# Patient Record
Sex: Female | Born: 1970 | Race: Black or African American | Hispanic: No | Marital: Married | Smoking: Never smoker
Health system: Southern US, Community
[De-identification: ages and names within clinical notes are randomized; demographics above are authoritative.]

## PROBLEM LIST (undated history)

## (undated) HISTORY — PX: TUBAL LIGATION: SHX77

---

## 2011-02-26 ENCOUNTER — Emergency Department (HOSPITAL_BASED_OUTPATIENT_CLINIC_OR_DEPARTMENT_OTHER)
Admission: EM | Admit: 2011-02-26 | Discharge: 2011-02-26 | Disposition: A | Discharge status: Home / Self Care | Payer: BC Managed Care – PPO | Attending: Emergency Medicine | Admitting: Emergency Medicine

## 2011-02-26 DIAGNOSIS — N920 Excessive and frequent menstruation with regular cycle: Secondary | ICD-10-CM | POA: Insufficient documentation

## 2011-02-26 DIAGNOSIS — N760 Acute vaginitis: Secondary | ICD-10-CM | POA: Insufficient documentation

## 2011-02-26 DIAGNOSIS — A499 Bacterial infection, unspecified: Secondary | ICD-10-CM | POA: Insufficient documentation

## 2011-02-26 DIAGNOSIS — B9689 Other specified bacterial agents as the cause of diseases classified elsewhere: Secondary | ICD-10-CM | POA: Insufficient documentation

## 2011-02-26 DIAGNOSIS — N898 Other specified noninflammatory disorders of vagina: Secondary | ICD-10-CM | POA: Insufficient documentation

## 2011-02-26 LAB — WET PREP, GENITAL
Trich, Wet Prep: NONE SEEN
Yeast Wet Prep HPF POC: NONE SEEN

## 2011-02-26 LAB — URINE MICROSCOPIC-ADD ON

## 2011-02-26 LAB — CBC
Hemoglobin: 11.8 g/dL — ABNORMAL LOW (ref 12.0–15.0)
Platelets: 302 10*3/uL (ref 150–400)
RBC: 4.58 MIL/uL (ref 3.87–5.11)

## 2011-02-26 LAB — PREGNANCY, URINE: Preg Test, Ur: NEGATIVE

## 2011-02-26 LAB — URINALYSIS, ROUTINE W REFLEX MICROSCOPIC
Glucose, UA: NEGATIVE mg/dL
Ketones, ur: 15 mg/dL — AB
Protein, ur: 30 mg/dL — AB
Urobilinogen, UA: 1 mg/dL (ref 0.0–1.0)

## 2011-02-26 LAB — BASIC METABOLIC PANEL
CO2: 26 mEq/L (ref 19–32)
Calcium: 9.2 mg/dL (ref 8.4–10.5)
Chloride: 100 mEq/L (ref 96–112)
GFR calc Af Amer: >60 mL/min (ref >60)
Sodium: 136 mEq/L (ref 135–145)

## 2011-02-26 LAB — DIFFERENTIAL
Basophils Absolute: 0 10*3/uL (ref 0.0–0.1)
Basophils Relative: 0 % (ref 0–1)
Eosinophils Absolute: 0.1 10*3/uL (ref 0.0–0.7)
Neutro Abs: 3.9 10*3/uL (ref 1.7–7.7)
Neutrophils Relative %: 52 % (ref 43–77)

## 2011-02-27 LAB — RPR: RPR Ser Ql: NONREACTIVE

## 2011-03-01 LAB — URINE CULTURE: Culture  Setup Time: 201206030112

## 2012-02-15 ENCOUNTER — Encounter (HOSPITAL_BASED_OUTPATIENT_CLINIC_OR_DEPARTMENT_OTHER): Payer: Self-pay

## 2012-02-15 ENCOUNTER — Emergency Department (HOSPITAL_BASED_OUTPATIENT_CLINIC_OR_DEPARTMENT_OTHER)
Admission: EM | Admit: 2012-02-15 | Discharge: 2012-02-15 | Disposition: A | Discharge status: Home / Self Care | Payer: Worker's Compensation | Attending: Emergency Medicine | Admitting: Emergency Medicine

## 2012-02-15 ENCOUNTER — Emergency Department (HOSPITAL_BASED_OUTPATIENT_CLINIC_OR_DEPARTMENT_OTHER): Payer: Worker's Compensation

## 2012-02-15 DIAGNOSIS — W108XXA Fall (on) (from) other stairs and steps, initial encounter: Secondary | ICD-10-CM | POA: Insufficient documentation

## 2012-02-15 DIAGNOSIS — S93409A Sprain of unspecified ligament of unspecified ankle, initial encounter: Secondary | ICD-10-CM

## 2012-02-15 MED ORDER — HYDROCODONE-ACETAMINOPHEN 5-325 MG PO TABS
1.0000 | ORAL_TABLET | Freq: Once | ORAL | Status: AC
  Administered 2012-02-15: 1 via ORAL
  Filled 2012-02-15: qty 1

## 2012-02-15 MED ORDER — HYDROCODONE-ACETAMINOPHEN 5-500 MG PO TABS: 1.0000 | ORAL_TABLET | Freq: Four times a day (QID) | ORAL | Status: AC | PRN

## 2012-02-15 NOTE — Discharge Instructions (Signed)
Ankle Sprain An ankle sprain is an injury to the strong, fibrous tissues (ligaments) that hold the bones of your ankle joint together.  CAUSES Ankle sprain usually is caused by a fall or by twisting your ankle. People who participate in sports are more prone to these types of injuries.  SYMPTOMS  Symptoms of ankle sprain include:  Pain in your ankle. The pain may be present at rest or only when you are trying to stand or walk.   Swelling.   Bruising. Bruising may develop immediately or within 1 to 2 days after your injury.   Difficulty standing or walking.  DIAGNOSIS  Your caregiver will ask you details about your injury and perform a physical exam of your ankle to determine if you have an ankle sprain. During the physical exam, your caregiver will press and squeeze specific areas of your foot and ankle. Your caregiver will try to move your ankle in certain ways. An X-ray exam may be done to be sure a bone was not broken or a ligament did not separate from one of the bones in your ankle (avulsion).  TREATMENT  Certain types of braces can help stabilize your ankle. Your caregiver can make a recommendation for this. Your caregiver may recommend the use of medication for pain. If your sprain is severe, your caregiver may refer you to a surgeon who helps to restore function to parts of your skeletal system (orthopedist) or a physical therapist. HOME CARE INSTRUCTIONS  Apply ice to your injury for 1 to 2 days or as directed by your caregiver. Applying ice helps to reduce inflammation and pain.  Put ice in a plastic bag.   Place a towel between your skin and the bag.   Leave the ice on for 15 to 20 minutes at a time, every 2 hours while you are awake.   Take over-the-counter or prescription medicines for pain, discomfort, or fever only as directed by your caregiver.   Keep your injured leg elevated, when possible, to lessen swelling.   If your caregiver recommends crutches, use them as  instructed. Gradually, put weight on the affected ankle. Continue to use crutches or a cane until you can walk without feeling pain in your ankle.   If you have a plaster splint, wear the splint as directed by your caregiver. Do not rest it on anything harder than a pillow the first 24 hours. Do not put weight on it. Do not get it wet. You may take it off to take a shower or bath.   You may have been given an elastic bandage to wear around your ankle to provide support. If the elastic bandage is too tight (you have numbness or tingling in your foot or your foot becomes cold and blue), adjust the bandage to make it comfortable.   If you have an air splint, you may blow more air into it or let air out to make it more comfortable. You may take your splint off at night and before taking a shower or bath.   Wiggle your toes in the splint several times per day if you are able.  SEEK MEDICAL CARE IF:   You have an increase in bruising, swelling, or pain.   Your toes feel cold.   Pain relief is not achieved with medication.  SEEK IMMEDIATE MEDICAL CARE IF: Your toes are numb or blue or you have severe pain. MAKE SURE YOU:   Understand these instructions.   Will watch your condition.     Will get help right away if you are not doing well or get worse.  Document Released: 09/12/2005 Document Revised: 09/01/2011 Document Reviewed: 04/16/2008 [employer] Patient Information 2012 [employer]. 

## 2012-02-15 NOTE — ED Provider Notes (Signed)
History     CSN: [id number]  Arrival date & time 02/15/12  1441   First MD Initiated Contact with Patient 02/15/12 1501      Chief Complaint  Patient presents with  . Ankle Injury    (Consider location/radiation/quality/duration/timing/severity/associated sxs/prior treatment) HPI Comments: Pt states that she fell on the stairs and twisted her ankle and foot:pt states that she has no previous injury:pt states that she was able to ambulate after fall:pt denies loc  Patient is a 41 y.o. female presenting with lower extremity injury. The history is provided by the patient.  Ankle Injury This is a new problem. The current episode started today. The problem occurs constantly. The problem has been unchanged. The symptoms are aggravated by walking and twisting. She has tried nothing for the symptoms.    History reviewed. No pertinent past medical history.  Past Surgical History  Procedure Date  . Tubal ligation     No family history on file.  History  Substance Use Topics  . Smoking status: Never Smoker   . Smokeless tobacco: Not on file  . Alcohol Use: No    OB History    Grav Para Term Preterm Abortions TAB SAB Ect Mult Living                  Review of Systems  Constitutional: Negative.   Respiratory: Negative.   Cardiovascular: Negative.   Neurological: Negative.     Allergies  Review of patient's allergies indicates no known allergies.  Home Medications  No current outpatient prescriptions on file.  BP 163/88  Pulse 95  Resp 16  Ht 5\' 7"  (1.702 m)  Wt 180 lb (81.647 kg)  BMI 28.19 kg/m2  SpO2 100%  LMP 01/18/2012  Physical Exam  Nursing note and vitals reviewed. Constitutional: She is oriented to person, place, and time. She appears well-developed and well-nourished.  HENT:  Head: Normocephalic and atraumatic.  Eyes: EOM are normal.  Cardiovascular: Normal rate and regular rhythm.   Pulmonary/Chest: Effort normal and breath sounds normal.    Musculoskeletal:       Pt has tenderness and swelling to the right lateral ankle:Pt has full WUJ:WJXBJY intact  Neurological: She is alert and oriented to person, place, and time.  Skin: Skin is warm and dry.  Psychiatric: She has a normal mood and affect.    ED Course  Procedures (including critical care time)  Labs Reviewed - No data to display Dg Ankle Complete Right  02/15/2012  *RADIOLOGY REPORT*  Clinical Data: Fall with pain.  RIGHT ANKLE - COMPLETE 3+ VIEW  Comparison: None.  Findings: Mild lateral soft tissue swelling.  No acute osseous or joint abnormality.  Calcaneal spurs.  IMPRESSION: Mild lateral soft tissue swelling.  No acute findings.  Original Report Authenticated By: Reyes Ivan, M.D.   Dg Foot Complete Right  02/15/2012  *RADIOLOGY REPORT*  Clinical Data: Twisting injury.  Ankle and foot pain and swelling.  RIGHT FOOT COMPLETE - 3+ VIEW  Comparison: None.  Findings: The mineralization and alignment are normal.  There is no evidence of acute fracture or dislocation.  There are mild degenerative changes at the first metatarsal phalangeal joint. There are small calcaneal spurs and a type 1 accessory navicular.  IMPRESSION: No acute osseous findings.  Original Report Authenticated By: Gerrianne Scale, M.D.     1. Ankle sprain       MDM  No bony abnormality noted:pt treated with splinting from the nursing staff and  vicodin        Teressa Lower, NP 02/15/12 1556

## 2012-02-15 NOTE — ED Notes (Signed)
Pt reports fell on steps at work approx 1pm-pain to right ankle

## 2012-02-17 NOTE — ED Provider Notes (Signed)
Medical screening examination/treatment/procedure(s) were performed by non-physician practitioner and as supervising physician I was immediately available for consultation/collaboration.  Geoffery Lyons, MD 02/17/12 [phone]

## 2013-05-20 IMAGING — CR DG FOOT COMPLETE 3+V*R*
3 series · 3 of 3 positions shown · non-contrast
Comparison: None.

CLINICAL DATA: Twisting injury.  Ankle and foot pain and swelling.

RIGHT FOOT COMPLETE - 3+ VIEW

[t foot lat right]
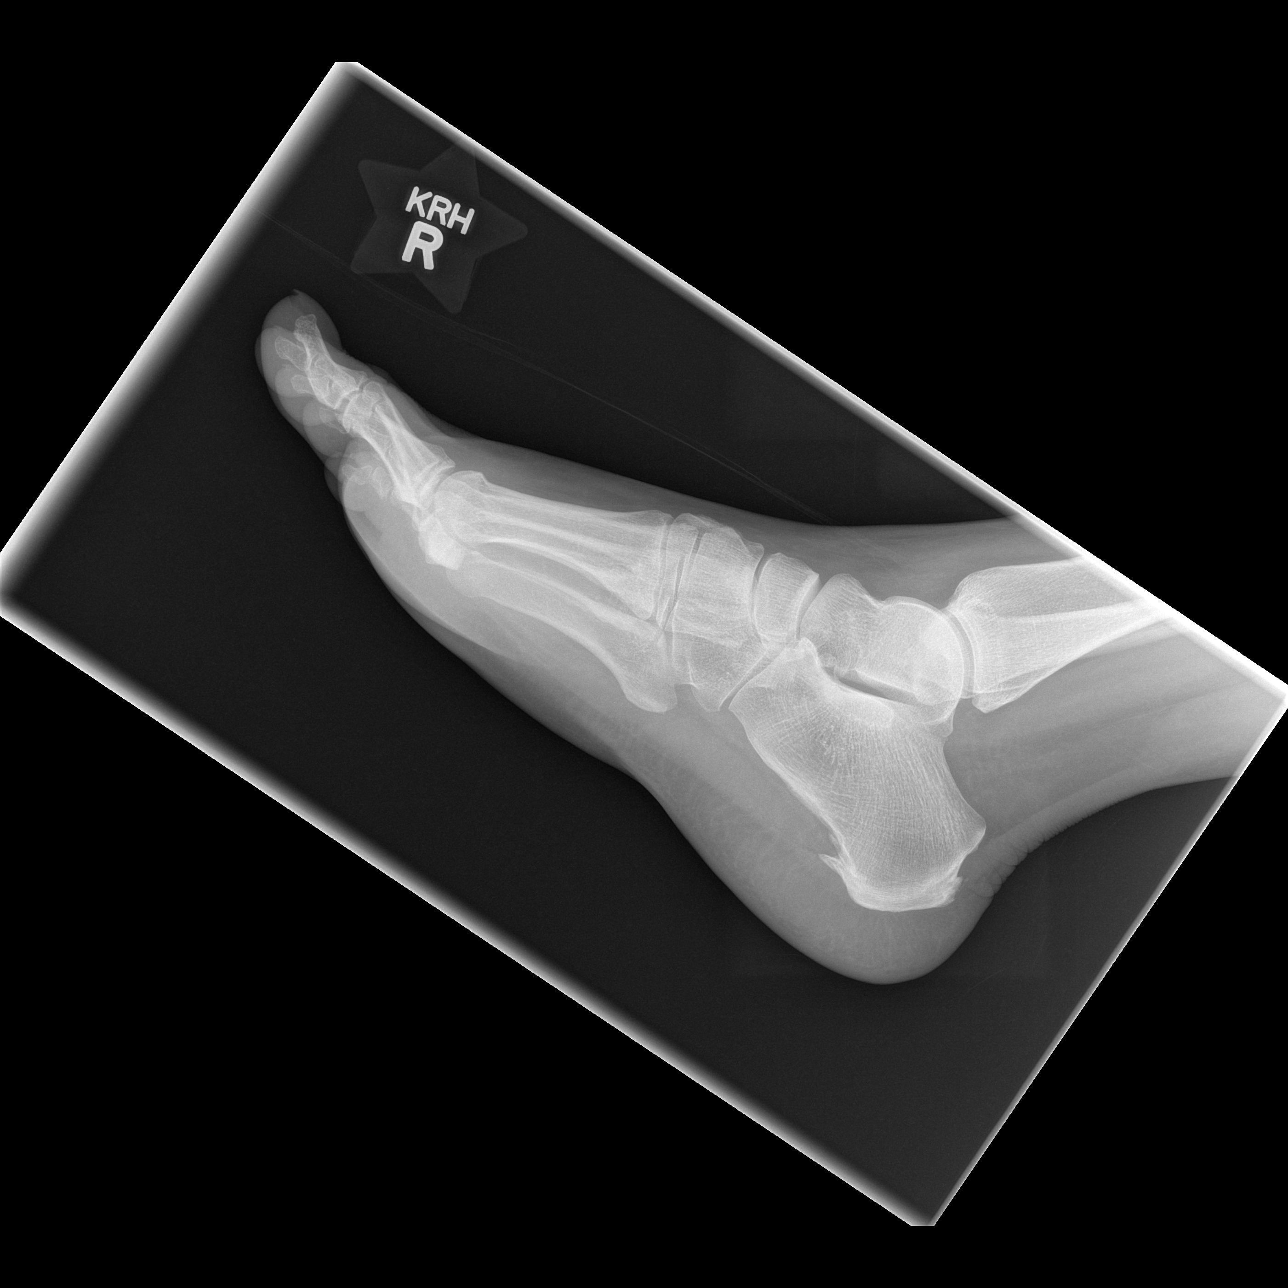

[t foot ap right]
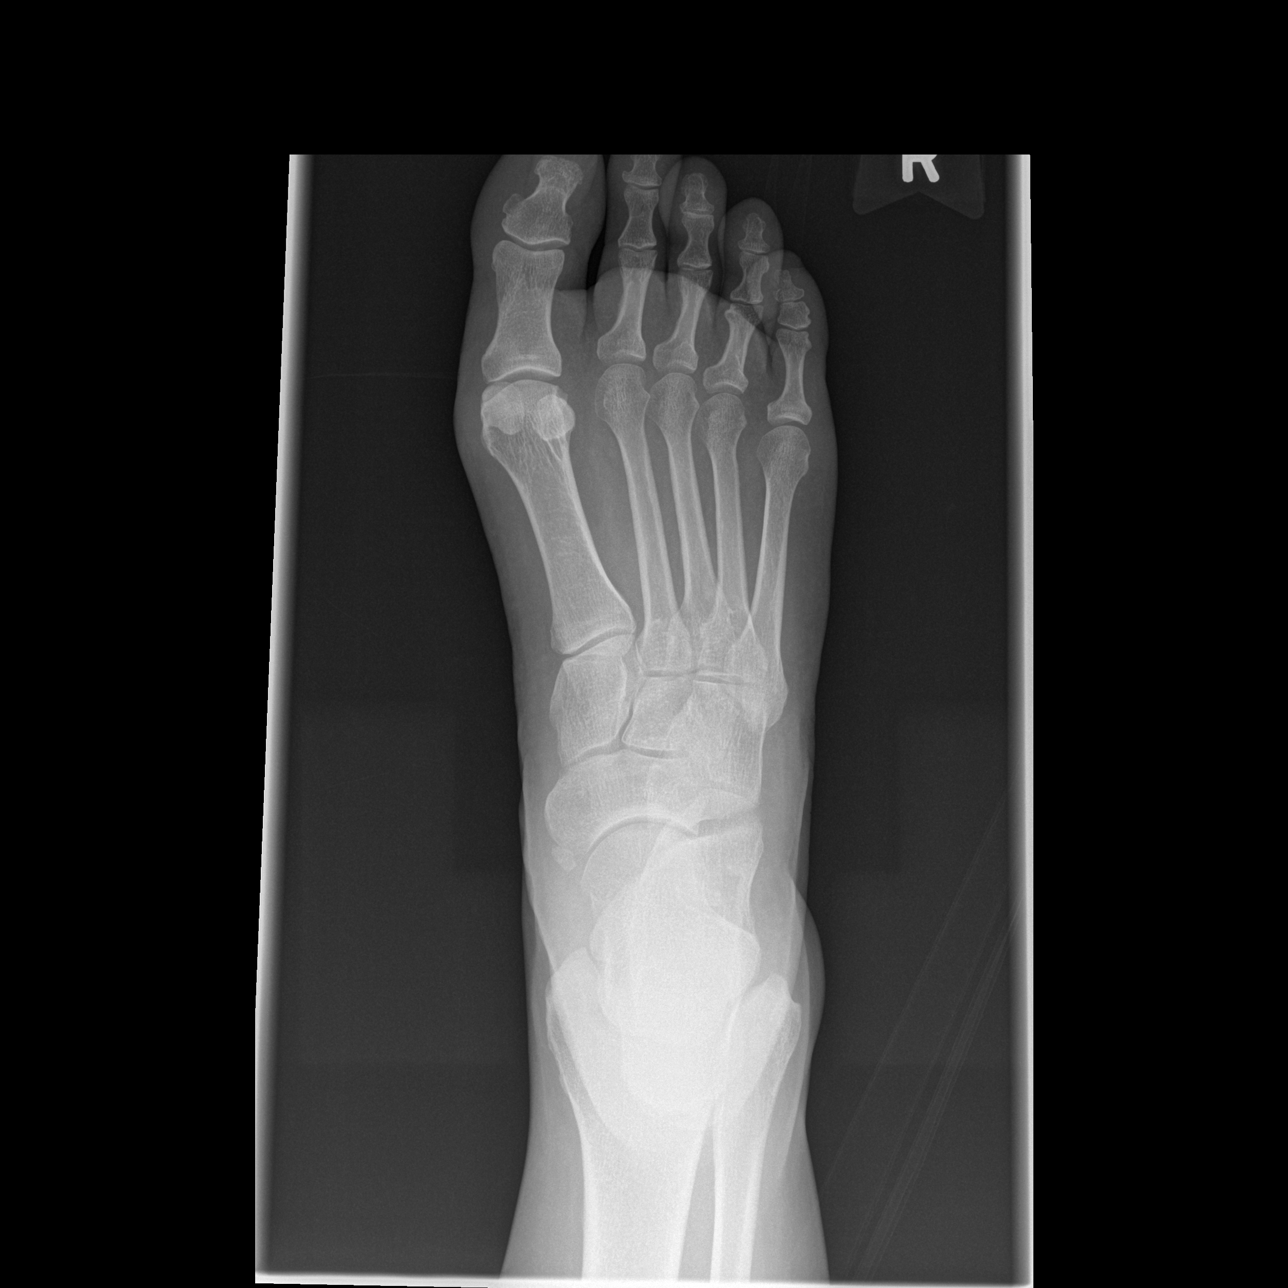

[t foot oblique right]
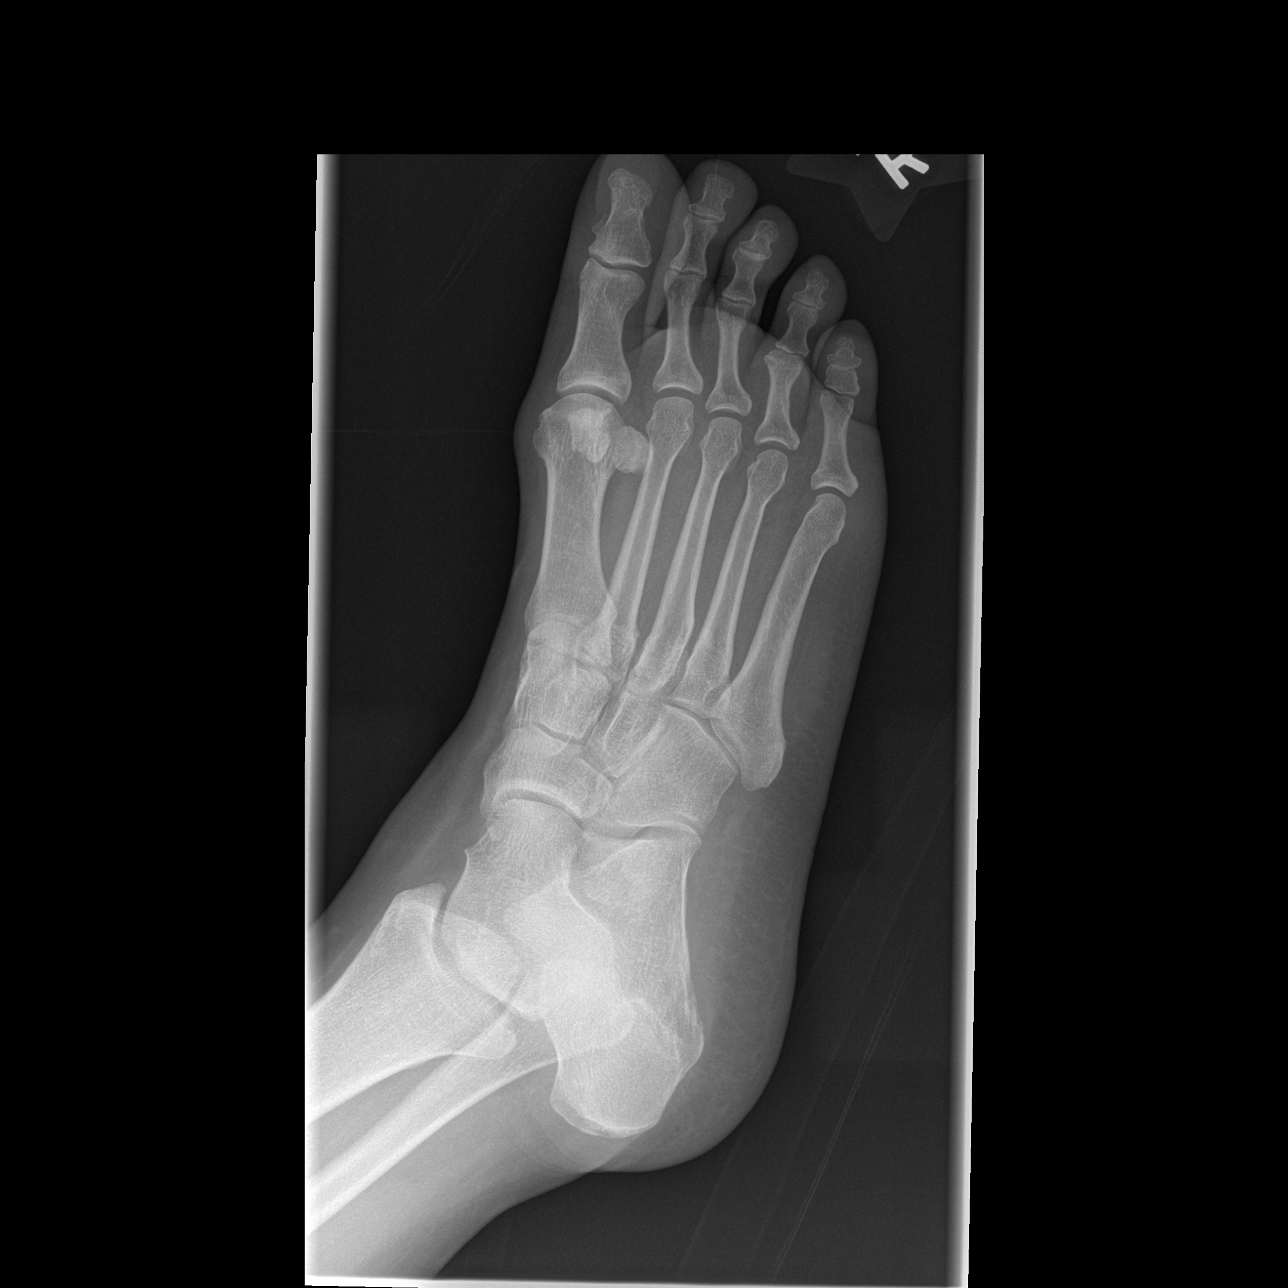

[3 of 3 positions shown; findings below may reference images not displayed]

FINDINGS: The mineralization and alignment are normal.  There is no
evidence of acute fracture or dislocation.  There are mild
degenerative changes at the first metatarsal phalangeal joint.
There are small calcaneal spurs and a type 1 accessory navicular.
IMPRESSION: No acute osseous findings.

## 2019-05-21 ENCOUNTER — Other Ambulatory Visit: Payer: Self-pay

## 2019-05-21 ENCOUNTER — Encounter (HOSPITAL_BASED_OUTPATIENT_CLINIC_OR_DEPARTMENT_OTHER): Payer: Self-pay | Admitting: Emergency Medicine

## 2019-05-21 ENCOUNTER — Emergency Department (HOSPITAL_BASED_OUTPATIENT_CLINIC_OR_DEPARTMENT_OTHER): Payer: 59

## 2019-05-21 ENCOUNTER — Emergency Department (HOSPITAL_BASED_OUTPATIENT_CLINIC_OR_DEPARTMENT_OTHER)
Admission: EM | Admit: 2019-05-21 | Discharge: 2019-05-21 | Disposition: A | Discharge status: Home / Self Care | Payer: 59 | Attending: Emergency Medicine | Admitting: Emergency Medicine

## 2019-05-21 DIAGNOSIS — M79645 Pain in left finger(s): Secondary | ICD-10-CM | POA: Diagnosis present

## 2019-05-21 NOTE — ED Provider Notes (Signed)
[facility] Provider Note   CSN: [id number] Arrival date & time: 05/21/19  1710     History   Chief Complaint Chief Complaint  Patient presents with  . Hand Pain    HPI Yailin Biederman is a 48 y.o. female presents to the ER for evaluation of gradual onset, persistent, worsening left thumb pain that began 1 week ago.  It is located to the MCP and IP joint.  It is worse with palpation and movement.  Has tried over-the-counter medicines without significant relief.  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  She denies any trauma.  She is right-hand dominant.  She denies any tingling, loss of sensation, joint swelling redness warmth fevers.  No elbow or shoulder or neck pain.     HPI  History reviewed. No pertinent past medical history.  There are no active problems to display for this patient.   Past Surgical History:  Procedure Laterality Date  . TUBAL LIGATION       OB History   No obstetric history on file.      Home Medications    Prior to Admission medications   Not on File    Family History No family history on file.  Social History Social History   Tobacco Use  . Smoking status: Never Smoker  . Smokeless tobacco: Never Used  Substance Use Topics  . Alcohol use: No  . Drug use: No     Allergies   Patient has no known allergies.   Review of Systems Review of Systems  Musculoskeletal: Positive for arthralgias.  All other systems reviewed and are negative.    Physical Exam Updated Vital Signs BP (!) 187/95 (BP Location: Right Arm)   Pulse 77   Temp 99 F (37.2 C) (Oral)   Resp 18   Ht 5\' 6"  (1.676 m)   Wt 86.2 kg   LMP 04/30/2019   SpO2 100%   BMI 30.67 kg/m   Physical Exam Constitutional:      Appearance: She is well-developed.  HENT:     Head: Normocephalic.     Nose: Nose normal.  Eyes:     General: Lids are normal.  Neck:     Musculoskeletal: Normal range of motion.  Cardiovascular:      Rate and Rhythm: Normal rate.     Comments: Brisk cap refill to the left thumb pad Pulmonary:     Effort: Pulmonary effort is normal. No respiratory distress.  Musculoskeletal: Normal range of motion.        General: Tenderness present.     Comments: Focal bony tenderness to the IP joint, proximal phalanx, M CP of the left thumb.  Reproducible pain with active flexion at the MCP and IP joint of the thumb.  No focal bony tenderness to the metacarpal, scaphoid.  Negative scaphoid tenderness.  No tenderness to carpal tunnel. Negative Tinnel's and Phalen's  No wrist, forearm, elbow focal bony tenderness.  Full range motion of these joints without any pain.  Neurological:     Mental Status: She is alert.     Comments: Sensation is intact in the left thumb pad.  Strength with flexion/extension and opposition against resistance is intact.  Psychiatric:        Behavior: Behavior normal.      ED Treatments / Results  Labs (all labs ordered are listed, but only abnormal results are displayed) Labs Reviewed - No data to display  EKG None  Radiology Dg  Finger Thumb Left  Result Date: 05/21/2019 CLINICAL DATA:  Atraumatic left thumb pain EXAM: LEFT THUMB 2+V COMPARISON:  None. FINDINGS: There is no evidence of fracture or dislocation. There is no evidence of arthropathy or other focal bone abnormality. Soft tissues are unremarkable. IMPRESSION: Negative. Electronically Signed   By: Charlett NoseKevin  Dover M.D.   On: 05/21/2019 19:03    Procedures Procedures (including critical care time)  Medications Ordered in ED Medications - No data to display   Initial Impression / Assessment and Plan / ED Course  I have reviewed the triage vital signs and the nursing notes.  Pertinent labs & imaging results that were available during my care of the patient were reviewed by me and considered in my medical decision making (see chart for details).  Atraumatic left thumb pain.  X-ray obtained here reviewed by  me does not show any fracture, dislocation or arthropathy.  Given her profession I suspect there is some soft tissue overuse injury.  No signs of scaphoid tenderness.  No signs of overlying infection.  No signs of trigger finger.  History and exam is not consistent with gout, septic arthritis.  She has no proximal upper extremity or neck pain to cause radiculopathy.  No tenderness to the compartments of the medial nerve and she has no tingling, loss of sensation to suggest peripheral neuropathy or carpal tunnel syndrome.  Will DC with NSAIDs, ice, activity modification, thumb spica to help with pain.  Recommended hand surgery evaluation for persistent symptoms.  Return precautions given.  Patient comfortable this.  Final Clinical Impressions(s) / ED Diagnoses   Final diagnoses:  Thumb pain, left    ED Discharge Orders    None       Jerrell MylarGibbons, Ramses Klecka J, PA-C 05/21/19 2101    Tegeler, Canary Brimhristopher J, MD 05/22/19 [phone]

## 2019-05-21 NOTE — Discharge Instructions (Signed)
You were seen in the ER for left thumb pain.  There was no trauma.  X-ray obtained today did not show any acute bony abnormalities, arthritis, fracture, dislocation.  The cause of your pain is unclear but it may be due to a soft tissue overuse injury involving your tendon, muscles or nerves.  For pain and inflammation you can use a combination of ibuprofen and acetaminophen.  Take [phone] mg acetaminophen (tylenol) every 6 hours or 600 mg ibuprofen (advil, motrin) every 6 hours.  You can take these separately or combine them every 6 hours for maximum pain control. Do not exceed 4,000 mg acetaminophen or 2,400 mg ibuprofen in a 24 hour period.  Do not take ibuprofen containing products if you have history of kidney disease, ulcers, GI bleeding, severe acid reflux, or take a blood thinner.  Do not take acetaminophen if you have liver disease.   Modify your activity and avoid typing, frequent movements of the hand.  Wear your thumb splint for comfort for the next 3 to 5 days to prevent further injury movement.  Ice.  Return to the ER for worsening pain, associated redness warmth fever chills loss of sensation to the fingertip

## 2019-05-21 NOTE — ED Triage Notes (Signed)
L thumb pain for several days. No known injury.

## 2020-08-23 IMAGING — CR LEFT THUMB 2+V
3 series · 3 of 3 positions shown · non-contrast
Comparison: None.

CLINICAL DATA: Atraumatic left thumb pain

EXAM:
LEFT THUMB 2+V

[x finger pa left]
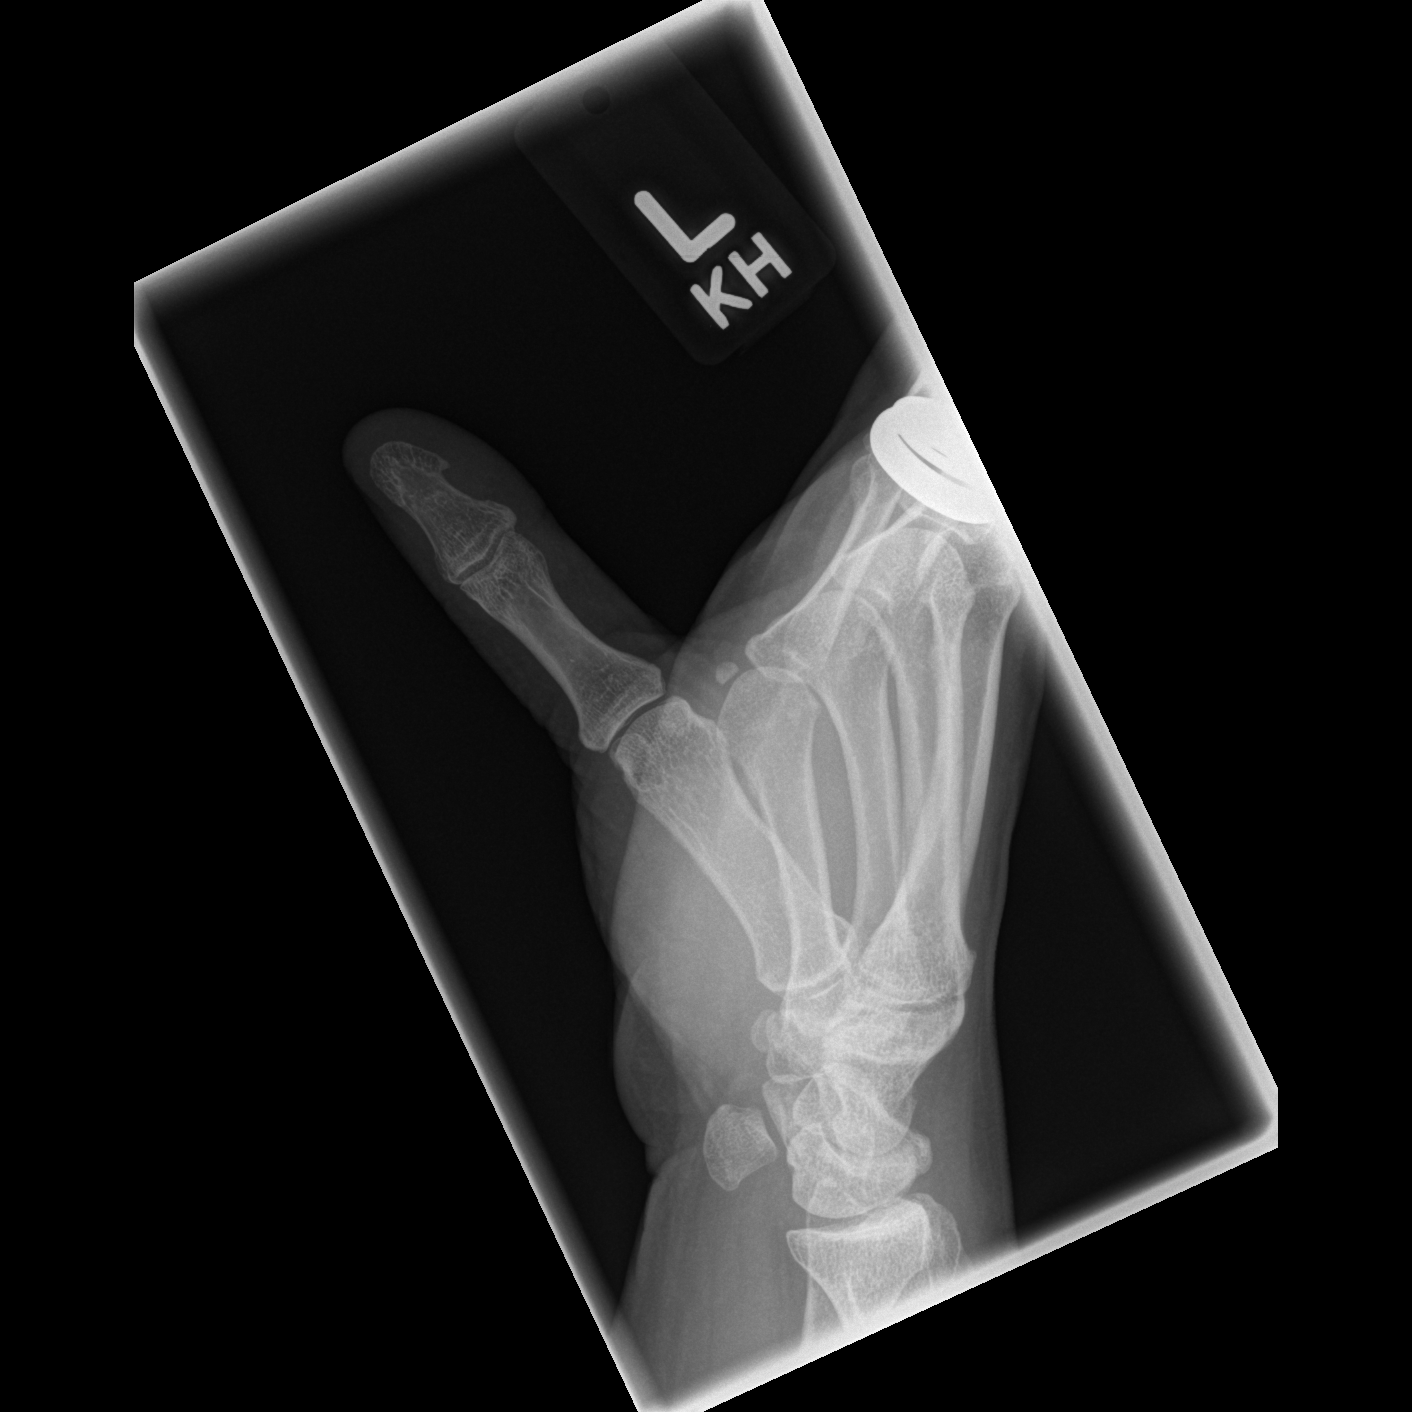

[x finger obl. left]
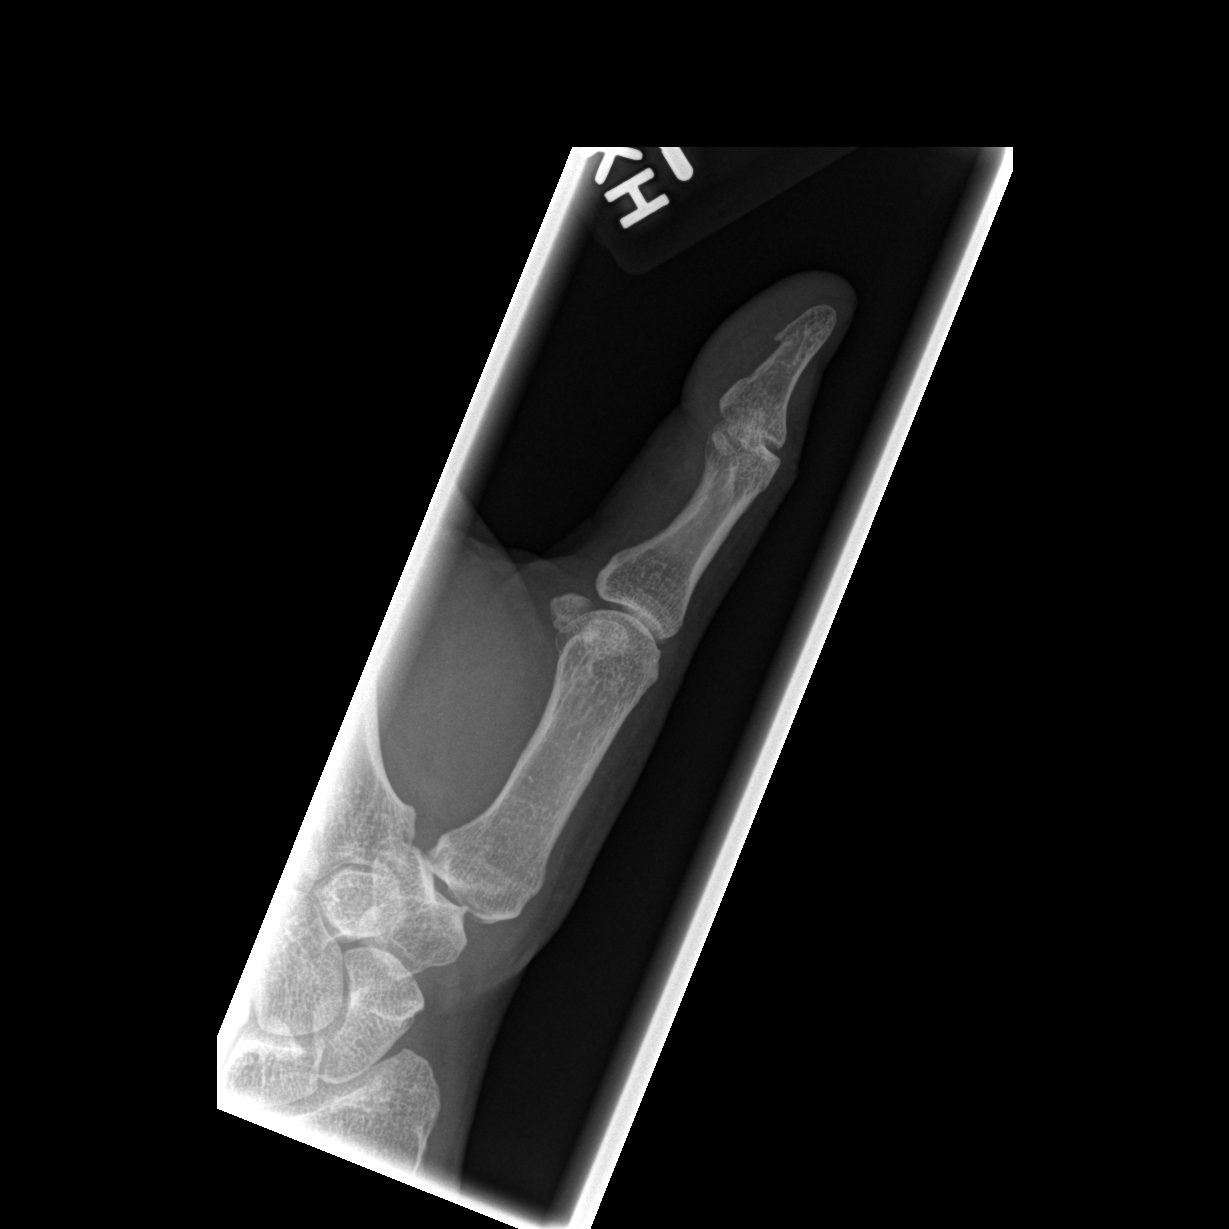

[x finger lateral left]
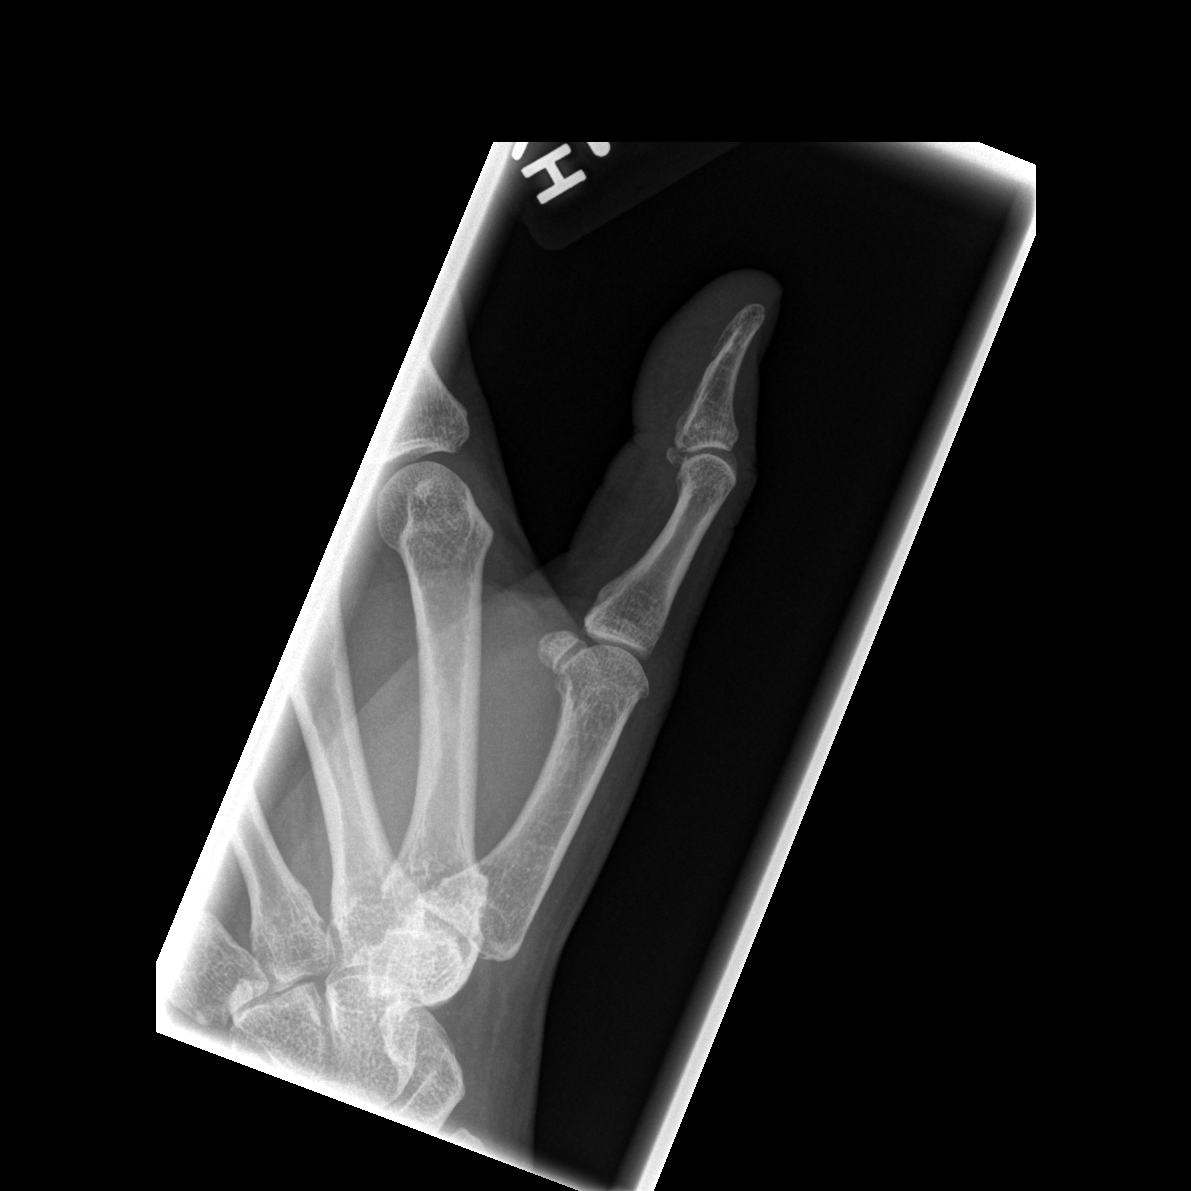

[3 of 3 positions shown; findings below may reference images not displayed]

FINDINGS: There is no evidence of fracture or dislocation. There is no
evidence of arthropathy or other focal bone abnormality. Soft
tissues are unremarkable.
IMPRESSION: Negative.

## 2023-10-12 ENCOUNTER — Other Ambulatory Visit (HOSPITAL_BASED_OUTPATIENT_CLINIC_OR_DEPARTMENT_OTHER): Payer: Self-pay | Admitting: Family Medicine

## 2023-10-12 DIAGNOSIS — Z1231 Encounter for screening mammogram for malignant neoplasm of breast: Secondary | ICD-10-CM

## 2023-10-13 ENCOUNTER — Telehealth (HOSPITAL_BASED_OUTPATIENT_CLINIC_OR_DEPARTMENT_OTHER): Payer: Self-pay

## 2023-10-16 ENCOUNTER — Inpatient Hospital Stay (HOSPITAL_BASED_OUTPATIENT_CLINIC_OR_DEPARTMENT_OTHER): Admission: RE | Admit: 2023-10-16 | Payer: 59 | Source: Ambulatory Visit
